# Patient Record
Sex: Female | Born: 1975 | Race: White | Hispanic: No | Marital: Single | Smoking: Never smoker
Health system: Southern US, Community
[De-identification: ages and names within clinical notes are randomized; demographics above are authoritative.]

---

## 2017-07-16 ENCOUNTER — Emergency Department (HOSPITAL_COMMUNITY): Payer: Medicaid Other

## 2017-07-16 ENCOUNTER — Emergency Department (HOSPITAL_COMMUNITY)
Admission: EM | Admit: 2017-07-16 | Discharge: 2017-07-16 | Disposition: A | Discharge status: Home / Self Care | Payer: Medicaid Other | Attending: Emergency Medicine | Admitting: Emergency Medicine

## 2017-07-16 ENCOUNTER — Encounter (HOSPITAL_COMMUNITY): Payer: Self-pay | Admitting: *Deleted

## 2017-07-16 DIAGNOSIS — F10120 Alcohol abuse with intoxication, uncomplicated: Secondary | ICD-10-CM | POA: Diagnosis not present

## 2017-07-16 DIAGNOSIS — R4182 Altered mental status, unspecified: Secondary | ICD-10-CM | POA: Insufficient documentation

## 2017-07-16 DIAGNOSIS — F10920 Alcohol use, unspecified with intoxication, uncomplicated: Secondary | ICD-10-CM

## 2017-07-16 LAB — CBC
HEMATOCRIT: 43.4 % (ref 36.0–46.0)
HEMOGLOBIN: 15 g/dL (ref 12.0–15.0)
MCH: 30.2 pg (ref 26.0–34.0)
MCHC: 34.6 g/dL (ref 30.0–36.0)
MCV: 87.3 fL (ref 78.0–100.0)
Platelets: 157 10*3/uL (ref 150–400)
RBC: 4.97 MIL/uL (ref 3.87–5.11)
RDW: 14.7 % (ref 11.5–15.5)
WBC: 8.4 10*3/uL (ref 4.0–10.5)

## 2017-07-16 LAB — COMPREHENSIVE METABOLIC PANEL
ALK PHOS: 57 U/L (ref 38–126)
ALT: 18 U/L (ref 14–54)
AST: 17 U/L (ref 15–41)
Albumin: 3.7 g/dL (ref 3.5–5.0)
Anion gap: 12 (ref 5–15)
BUN: 19 mg/dL (ref 6–20)
CALCIUM: 8.9 mg/dL (ref 8.9–10.3)
CO2: 18 mmol/L — ABNORMAL LOW (ref 22–32)
CREATININE: 0.84 mg/dL (ref 0.44–1.00)
Chloride: 109 mmol/L (ref 101–111)
Glucose, Bld: 144 mg/dL — ABNORMAL HIGH (ref 65–99)
Potassium: 3.1 mmol/L — ABNORMAL LOW (ref 3.5–5.1)
Sodium: 139 mmol/L (ref 135–145)
Total Bilirubin: 0.5 mg/dL (ref 0.3–1.2)
Total Protein: 7.7 g/dL (ref 6.5–8.1)

## 2017-07-16 LAB — CBG MONITORING, ED: Glucose-Capillary: 139 mg/dL — ABNORMAL HIGH (ref 65–99)

## 2017-07-16 LAB — ETHANOL: ALCOHOL ETHYL (B): 114 mg/dL — AB (ref <5)

## 2017-07-16 LAB — I-STAT BETA HCG BLOOD, ED (MC, WL, AP ONLY): I-stat hCG, quantitative: <5 m[IU]/mL (ref <5)

## 2017-07-16 MED ORDER — SODIUM CHLORIDE 0.9 % IV BOLUS (SEPSIS)
1000.0000 mL | Freq: Once | INTRAVENOUS | Status: AC
  Administered 2017-07-16: 1000 mL via INTRAVENOUS

## 2017-07-16 NOTE — ED Provider Notes (Signed)
[facility]-EMERGENCY DEPT Provider Note   CSN: [id number] Arrival date & time: 07/16/17  0138     History   Chief Complaint Chief Complaint  Patient presents with  . Loss of Consciousness  . Alcohol Intoxication   Level V caveat: Altered mental status  HPI Jasmine Griffin is a 41 y.o. female.  HPI Patient is a 41 year old female presents the emergency department after being found minimally responsive at bachelorette party tonight.  Family reports the patient does not usually drink and had several drinks tonight.  Friends reported that the patient slid out of the chair and became unresponsive.  One bystander stated there was some jerking and possible seizure activity.  No prior history of seizures.  No tongue biting or urinary incontinence.  On EMS arrival the patient was initially a GCS of 3 but in route became responsive to painful stimuli.     No past medical history on file.  There are no active problems to display for this patient.   No past surgical history on file.  OB History    No data available       Home Medications    Prior to Admission medications   Medication Sig Start Date End Date Taking? Authorizing Provider  gabapentin (NEURONTIN) 300 MG capsule Take 600 mg by mouth 3 (three) times daily. 07/01/17  Yes [provider]  QUEtiapine (SEROQUEL) 25 MG tablet Take 25 mg by mouth 3 (three) times daily.   Yes [provider]  rOPINIRole (REQUIP) 1 MG tablet Take 3 mg by mouth at bedtime. 07/01/17  Yes [provider]  topiramate (TOPAMAX) 50 MG tablet Take 50 mg by mouth at bedtime. 07/01/17  Yes [provider]    Family History No family history on file.  Social History Social History  Substance Use Topics  . Smoking status: Never Smoker  . Smokeless tobacco: Never Used  . Alcohol use Yes     Allergies   Amoxicillin; Lisinopril; and Morphine and related   Review of Systems Review of Systems  Unable to perform ROS:  Mental status change     Physical Exam Updated Vital Signs BP 114/74   Pulse 98   Temp 97.9 F (36.6 C) (Temporal)   Resp 16   LMP  (LMP Unknown)   SpO2 98%   Physical Exam  Constitutional: She appears well-developed and well-nourished.  HENT:  Head: Normocephalic and atraumatic.  Eyes: Pupils are equal, round, and reactive to light.  Neck: Normal range of motion.  Cardiovascular: Normal rate.   Pulmonary/Chest: Effort normal and breath sounds normal.  Abdominal: Soft. She exhibits no distension.  Musculoskeletal: Normal range of motion.  Neurological:  Awakens to painful stimuli.  Noted to move all 4 extremities .  Does not follow commands  Skin: Skin is warm.  Psychiatric:  Unable to test  Nursing note and vitals reviewed.    ED Treatments / Results  Labs (all labs ordered are listed, but only abnormal results are displayed) Labs Reviewed  ETHANOL - Abnormal; Notable for the following:       Result Value   Alcohol, Ethyl (B) 114 (*)    All other components within normal limits  COMPREHENSIVE METABOLIC PANEL - Abnormal; Notable for the following:    Potassium 3.1 (*)    CO2 18 (*)    Glucose, Bld 144 (*)    All other components within normal limits  CBG MONITORING, ED - Abnormal; Notable for the following:    Glucose-Capillary 139 (*)  All other components within normal limits  CBC  RAPID URINE DRUG SCREEN, HOSP PERFORMED  URINALYSIS, ROUTINE W REFLEX MICROSCOPIC  I-STAT BETA HCG BLOOD, ED ([facility], [facility], [facility] ONLY)    EKG  EKG Interpretation  Date/Time:  Sunday July 16 2017 01:46:14 EDT Ventricular Rate:  111 PR Interval:    QRS Duration: 84 QT Interval:  334 QTC Calculation: 454 R Axis:   24 Text Interpretation:  Sinus tachycardia Left atrial enlargement No old tracing to compare Confirmed by Azalia Bilisampos, Tiahna Cure ([id number]) on 07/16/2017 3:18:06 AM       Radiology Ct Head Wo Contrast  Result Date: 07/16/2017 CLINICAL DATA:  Altered level of consciousness  (LOC), unexplained; C-spine trauma, high clinical risk (NEXUS/CCR) Altered mental status (AMS), unclear cause. Slid from chair to floor. EXAM: CT HEAD WITHOUT CONTRAST CT CERVICAL SPINE WITHOUT CONTRAST TECHNIQUE: Multidetector CT imaging of the head and cervical spine was performed following the standard protocol without intravenous contrast. Multiplanar CT image reconstructions of the cervical spine were also generated. COMPARISON:  None. FINDINGS: CT HEAD FINDINGS Brain: No intracranial hemorrhage, mass effect, or midline shift. No hydrocephalus. The basilar cisterns are patent. No evidence of territorial infarct or acute ischemia. No extra-axial or intracranial fluid collection. Vascular: No hyperdense vessel. Generalized increased density of intracranial vasculature. Skull: No fracture or focal lesion. Sinuses/Orbits: Paranasal sinuses and mastoid air cells are clear. The visualized orbits are unremarkable. Other: None. CT CERVICAL SPINE FINDINGS Alignment: Normal. Skull base and vertebrae: No acute fracture. Vertebral body heights are maintained. The dens and skull base are intact. Soft tissues and spinal canal: No prevertebral fluid or swelling. No visible canal hematoma. Disc levels: Mild disc space narrowing and endplate spurring at C6-C7. Remaining disc spaces are preserved. Upper chest: Negative. Other: None. IMPRESSION: 1.  No acute intracranial abnormality.  No skull fracture. 2. No fracture or subluxation of the cervical spine. Electronically Signed   By: Rubye OaksMelanie  Ehinger M.D.   On: 07/16/2017 02:50   Ct Cervical Spine Wo Contrast  Result Date: 07/16/2017 CLINICAL DATA:  Altered level of consciousness (LOC), unexplained; C-spine trauma, high clinical risk (NEXUS/CCR) Altered mental status (AMS), unclear cause. Slid from chair to floor. EXAM: CT HEAD WITHOUT CONTRAST CT CERVICAL SPINE WITHOUT CONTRAST TECHNIQUE: Multidetector CT imaging of the head and cervical spine was performed following the  standard protocol without intravenous contrast. Multiplanar CT image reconstructions of the cervical spine were also generated. COMPARISON:  None. FINDINGS: CT HEAD FINDINGS Brain: No intracranial hemorrhage, mass effect, or midline shift. No hydrocephalus. The basilar cisterns are patent. No evidence of territorial infarct or acute ischemia. No extra-axial or intracranial fluid collection. Vascular: No hyperdense vessel. Generalized increased density of intracranial vasculature. Skull: No fracture or focal lesion. Sinuses/Orbits: Paranasal sinuses and mastoid air cells are clear. The visualized orbits are unremarkable. Other: None. CT CERVICAL SPINE FINDINGS Alignment: Normal. Skull base and vertebrae: No acute fracture. Vertebral body heights are maintained. The dens and skull base are intact. Soft tissues and spinal canal: No prevertebral fluid or swelling. No visible canal hematoma. Disc levels: Mild disc space narrowing and endplate spurring at C6-C7. Remaining disc spaces are preserved. Upper chest: Negative. Other: None. IMPRESSION: 1.  No acute intracranial abnormality.  No skull fracture. 2. No fracture or subluxation of the cervical spine. Electronically Signed   By: Rubye OaksMelanie  Ehinger M.D.   On: 07/16/2017 02:50    Procedures Procedures (including critical care time)  Medications Ordered in ED Medications  sodium chloride 0.9 % bolus  1,000 mL (0 mLs Intravenous Stopped 07/16/17 0444)     Initial Impression / Assessment and Plan / ED Course  I have reviewed the triage vital signs and the nursing notes.  Pertinent labs & imaging results that were available during my care of the patient were reviewed by me and considered in my medical decision making (see chart for details).   vital signs are stable this time.  The patient is without respiratory compromise.  At this time will hold Narcan.  We'll continue to monitor.  Labs and head CT   5:57 AM Patient is alert and oriented in the emergency  department.  She feels much better.  She is fully ambulatory in the emergency department.  No complaints at this time.  Discharge home with her mother.  Given possible seizure activity the patient has been told to follow-up with neurology and given standard seizure precautions.   Final Clinical Impressions(s) / ED Diagnoses   Final diagnoses:  Altered mental status, unspecified altered mental status type  Alcoholic intoxication without complication [facility])    New Prescriptions New Prescriptions   No medications on file     Azalia Bilis, MD 07/16/17 [phone]

## 2017-07-16 NOTE — ED Notes (Signed)
Pt requesting to go home, c/o headache; Dr.Campos aware

## 2017-07-16 NOTE — ED Triage Notes (Signed)
Pt to ED by EMS: pt was at a bachelorette party tonight, friends report pt slid from chair into floor and was unresponsive. On EMS arrival, GCS 3, pt unresponsive to painful stimuli lasting ~15 mins, then was a GCS of 15. 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 NV with EMS. Pt answering to some questions then will follow asleep, denies pain.

## 2017-07-16 NOTE — ED Notes (Signed)
Pt removed c-collar.

## 2017-07-16 NOTE — ED Notes (Signed)
Pt more alert and answering questions appropriately; Coke and sandwich provided

## 2017-07-16 NOTE — ED Notes (Signed)
Pt taken to CT.

## 2017-07-16 NOTE — ED Notes (Signed)
Pt ambulatory to restroom with steady gait.

## 2019-04-20 IMAGING — CT CT HEAD W/O CM
5 of 8 series · 17 of 47 positions shown, 18 images · non-contrast
Comparison: None.

CLINICAL DATA: Altered level of consciousness (LOC), unexplained;
C-spine trauma, high clinical risk (NEXUS/CCR) Altered mental status
(AMS), unclear cause. Slid from chair to floor.

EXAM:
CT HEAD WITHOUT CONTRAST
CT CERVICAL SPINE WITHOUT CONTRAST
TECHNIQUE: Multidetector CT imaging of the head and cervical spine was
performed following the standard protocol without intravenous
contrast. Multiplanar CT image reconstructions of the cervical spine
were also generated.

[Series 3: head without · axial · non-contrast · 0.46mm/px · z∈[-130,+50]mm · 3 of 37 slices shown, 4 images]
[im 1/37  brain]
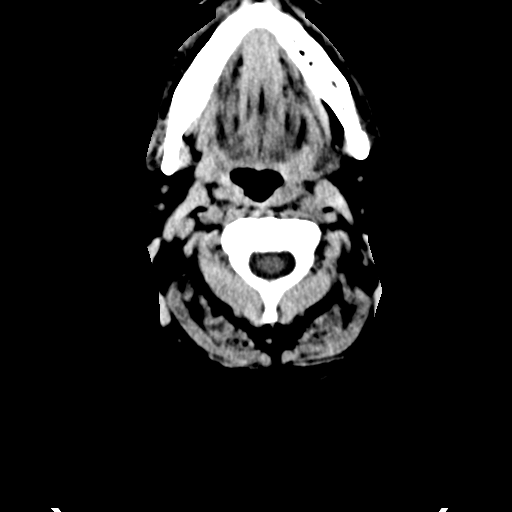
[im 1/37  bone]
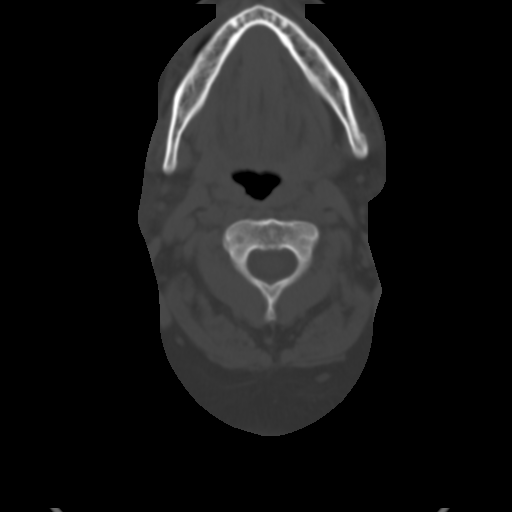
[im 19/37  brain]
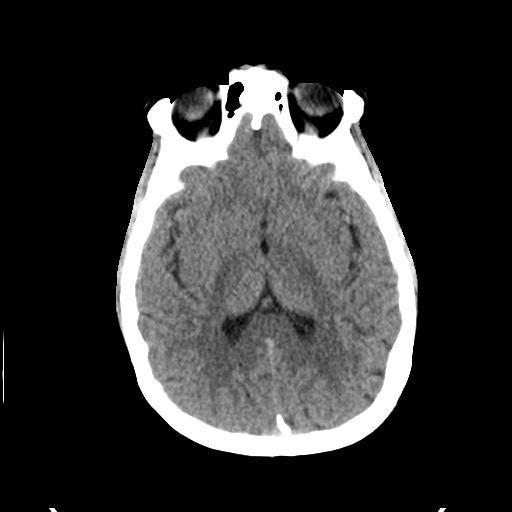
[im 37/37  brain]
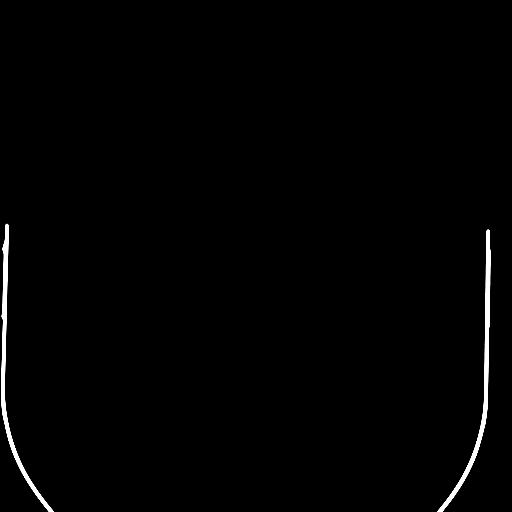

[Series 4: head bone · axial · 0.46mm/px · z∈[-104,+26]mm · 6 of 93 slices shown]
[im 14/93  bone]
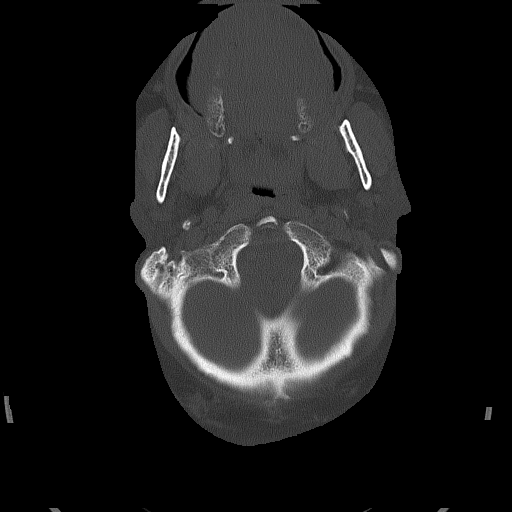
[im 27/93  bone]
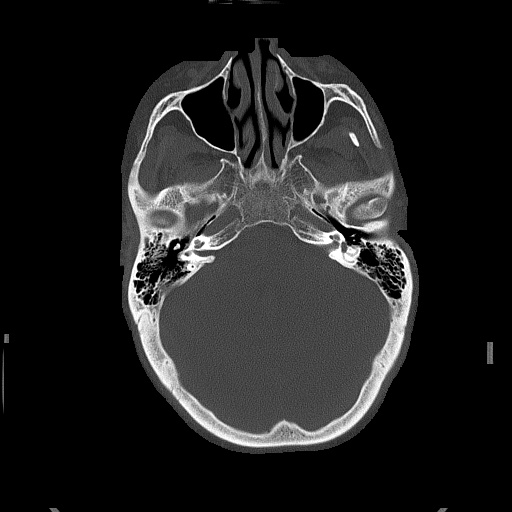
[im 40/93  bone]
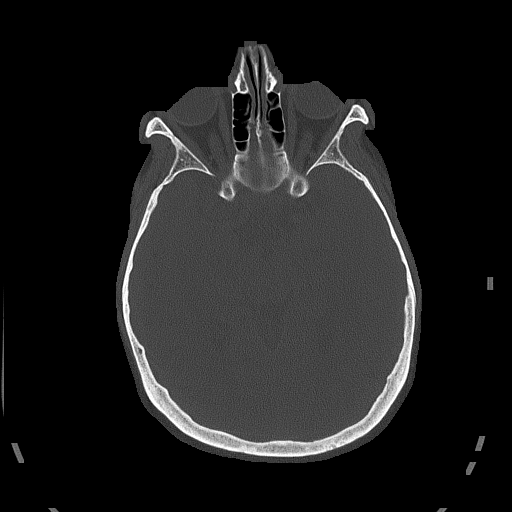
[im 53/93  bone]
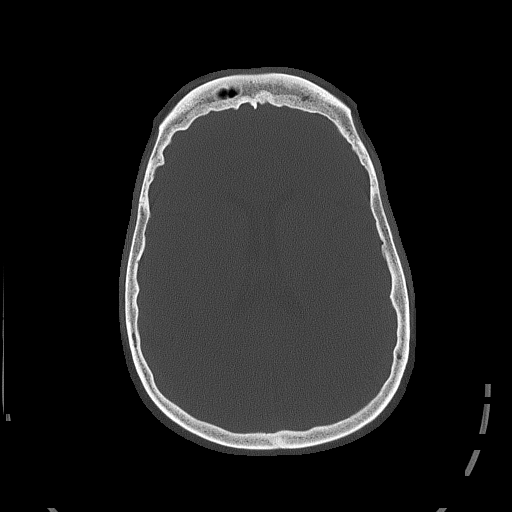
[im 66/93  bone]
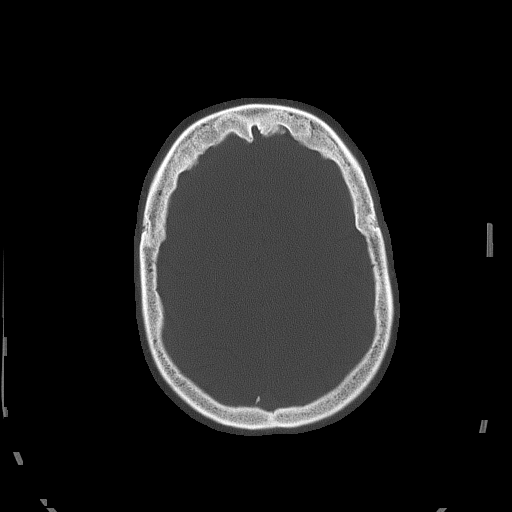
[im 79/93  bone]
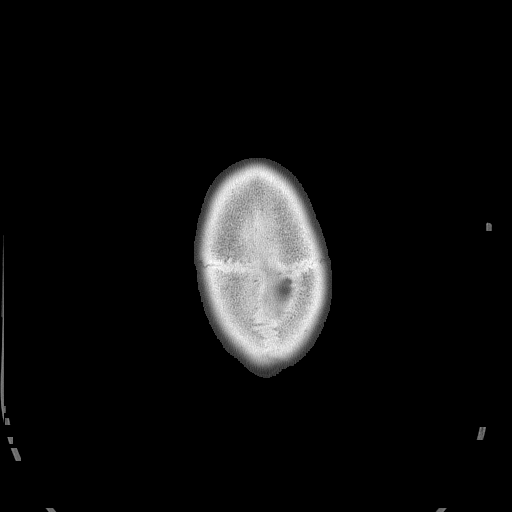

[Series 5: head without cor · coronal · non-contrast · 0.35mm/px · 3 of 72 slices shown]
[im 27/72  brain]
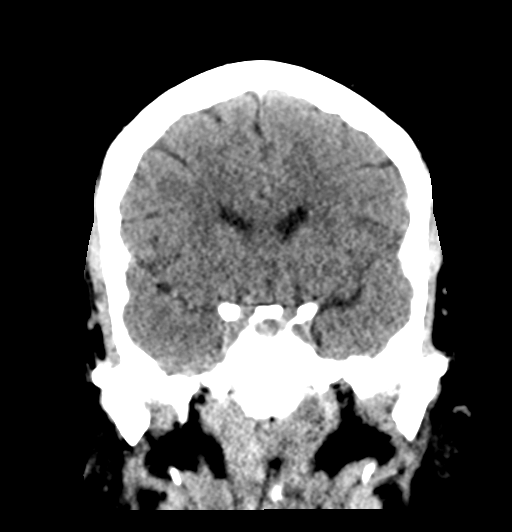
[im 36/72  brain]
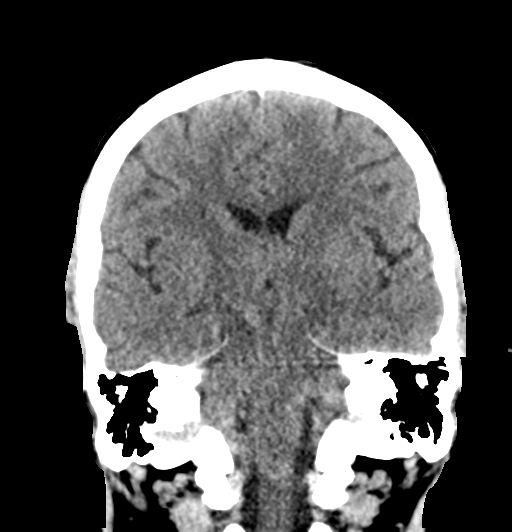
[im 45/72  brain]
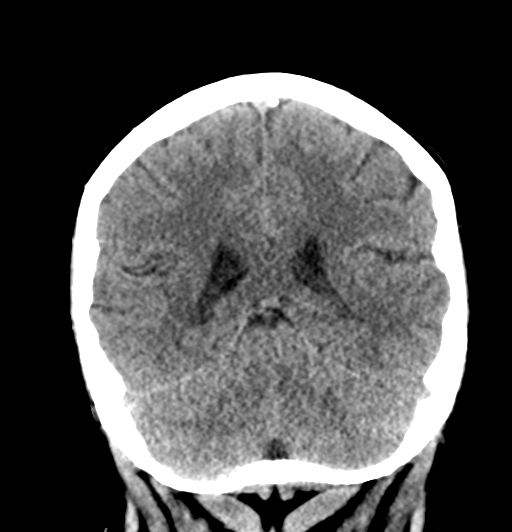

[Series 6: head without sag · sagittal · non-contrast · 0.36mm/px · 2 of 66 slices shown]
[im 22/66  brain]
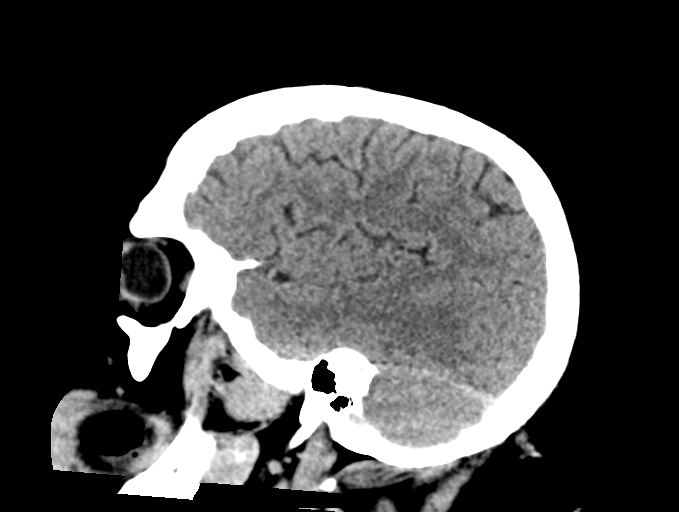
[im 44/66  brain]
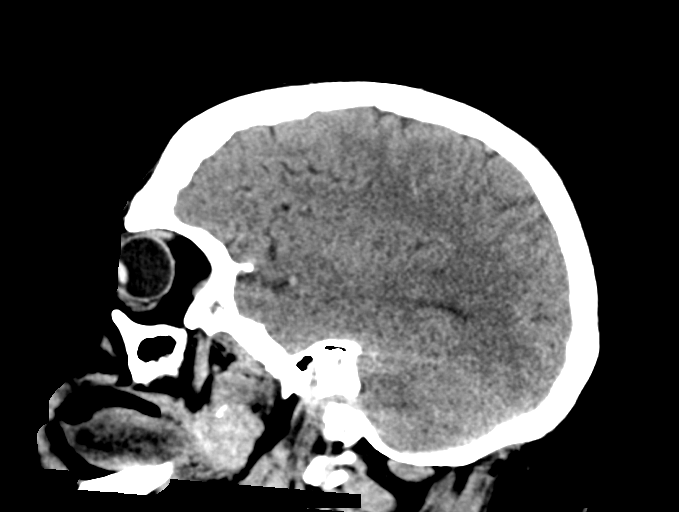

[Series 7: c_spine 2.0 st · axial · 0.36mm/px · z∈[-264,-214]mm · 3 of 112 slices shown]
[im 13/112  brain]
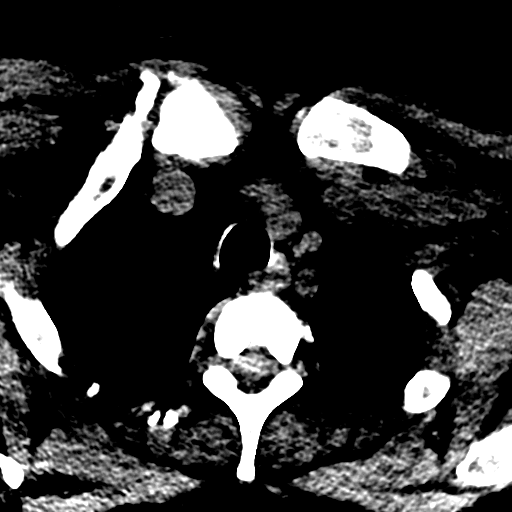
[im 25/112  brain]
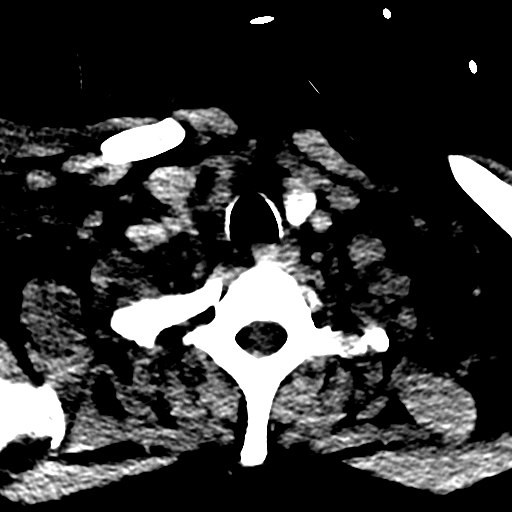
[im 38/112  brain]
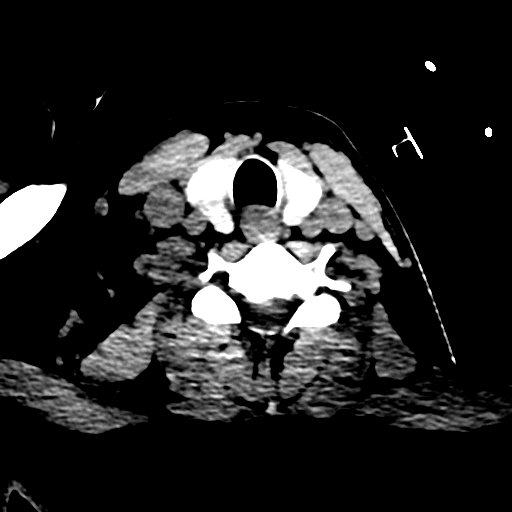

[17 of 47 positions shown; findings below may reference images not displayed]

FINDINGS: CT HEAD FINDINGS

Brain: No intracranial hemorrhage, mass effect, or midline shift. No
hydrocephalus. The basilar cisterns are patent. No evidence of
territorial infarct or acute ischemia. No extra-axial or
intracranial fluid collection.

Vascular: No hyperdense vessel. Generalized increased density of
intracranial vasculature.

Skull: No fracture or focal lesion.

Sinuses/Orbits: Paranasal sinuses and mastoid air cells are clear.
The visualized orbits are unremarkable.

Other: None.

CT CERVICAL SPINE FINDINGS

Alignment: Normal.

Skull base and vertebrae: No acute fracture. Vertebral body heights
are maintained. The dens and skull base are intact.

Soft tissues and spinal canal: No prevertebral fluid or swelling. No
visible canal hematoma.

Disc levels: Mild disc space narrowing and endplate spurring at
C6-C7. Remaining disc spaces are preserved.

Upper chest: Negative.

Other: None.
IMPRESSION: 1.  No acute intracranial abnormality.  No skull fracture.
2. No fracture or subluxation of the cervical spine.
